# Patient Record
Sex: Male | Born: 1980 | Hispanic: Yes | Marital: Married | Smoking: Never smoker
Health system: Southern US, Community
[De-identification: ages and names within clinical notes are randomized; demographics above are authoritative.]

## PROBLEM LIST (undated history)

## (undated) DIAGNOSIS — I10 Essential (primary) hypertension: Secondary | ICD-10-CM

---

## 2016-11-02 ENCOUNTER — Encounter (HOSPITAL_COMMUNITY): Payer: Self-pay | Admitting: *Deleted

## 2016-11-02 ENCOUNTER — Other Ambulatory Visit: Payer: Self-pay

## 2016-11-02 ENCOUNTER — Emergency Department (HOSPITAL_COMMUNITY): Payer: Self-pay

## 2016-11-02 DIAGNOSIS — K1379 Other lesions of oral mucosa: Secondary | ICD-10-CM | POA: Insufficient documentation

## 2016-11-02 DIAGNOSIS — R0789 Other chest pain: Secondary | ICD-10-CM | POA: Insufficient documentation

## 2016-11-02 DIAGNOSIS — I10 Essential (primary) hypertension: Secondary | ICD-10-CM | POA: Insufficient documentation

## 2016-11-02 LAB — I-STAT TROPONIN, ED: Troponin i, poc: 0 ng/mL (ref 0.00–0.08)

## 2016-11-02 LAB — CBC
HEMATOCRIT: 39.2 % (ref 39.0–52.0)
Hemoglobin: 13.9 g/dL (ref 13.0–17.0)
MCH: 31.1 pg (ref 26.0–34.0)
MCHC: 35.5 g/dL (ref 30.0–36.0)
MCV: 87.7 fL (ref 78.0–100.0)
Platelets: 180 10*3/uL (ref 150–400)
RBC: 4.47 MIL/uL (ref 4.22–5.81)
RDW: 12.4 % (ref 11.5–15.5)
WBC: 6.7 10*3/uL (ref 4.0–10.5)

## 2016-11-02 LAB — BASIC METABOLIC PANEL
Anion gap: 10 (ref 5–15)
BUN: 26 mg/dL — AB (ref 6–20)
CHLORIDE: 102 mmol/L (ref 101–111)
CO2: 22 mmol/L (ref 22–32)
Calcium: 9.3 mg/dL (ref 8.9–10.3)
Creatinine, Ser: 0.94 mg/dL (ref 0.61–1.24)
GFR calc Af Amer: >60 mL/min (ref >60)
GFR calc non Af Amer: >60 mL/min (ref >60)
GLUCOSE: 109 mg/dL — AB (ref 65–99)
POTASSIUM: 3.5 mmol/L (ref 3.5–5.1)
Sodium: 134 mmol/L — ABNORMAL LOW (ref 135–145)

## 2016-11-02 MED ORDER — ONDANSETRON 4 MG PO TBDP
ORAL_TABLET | ORAL | Status: AC
  Filled 2016-11-02: qty 1

## 2016-11-02 MED ORDER — ONDANSETRON 4 MG PO TBDP
4.0000 mg | ORAL_TABLET | Freq: Once | ORAL | Status: AC | PRN
  Administered 2016-11-02: 4 mg via ORAL

## 2016-11-02 NOTE — ED Triage Notes (Signed)
Pt c/o difficulty swallowing after being around fumes today at work. Pt reports R sided upper chest discomfort and mild shortness of breath. Lung sounds clear. Endorses nausea. Spanish Interpreter used for triage

## 2016-11-03 ENCOUNTER — Emergency Department (HOSPITAL_COMMUNITY)
Admission: EM | Admit: 2016-11-03 | Discharge: 2016-11-03 | Disposition: A | Discharge status: Home / Self Care | Payer: Self-pay | Attending: Emergency Medicine | Admitting: Emergency Medicine

## 2016-11-03 DIAGNOSIS — R0789 Other chest pain: Secondary | ICD-10-CM

## 2016-11-03 DIAGNOSIS — K1379 Other lesions of oral mucosa: Secondary | ICD-10-CM

## 2016-11-03 HISTORY — DX: Essential (primary) hypertension: I10

## 2016-11-03 LAB — RAPID STREP SCREEN (MED CTR MEBANE ONLY): Streptococcus, Group A Screen (Direct): NEGATIVE

## 2016-11-03 LAB — TROPONIN I: Troponin I: <0.03 ng/mL (ref <0.03)

## 2016-11-03 LAB — D-DIMER, QUANTITATIVE: D-Dimer, Quant: 0.27 ug/mL-FEU (ref 0.00–0.50)

## 2016-11-03 MED ORDER — DIPHENHYDRAMINE HCL 50 MG/ML IJ SOLN
25.0000 mg | Freq: Once | INTRAMUSCULAR | Status: AC
Start: 2016-11-03 — End: 2016-11-03
  Administered 2016-11-03: 25 mg via INTRAVENOUS
  Filled 2016-11-03: qty 1

## 2016-11-03 MED ORDER — DIPHENHYDRAMINE HCL 25 MG PO TABS
25.0000 mg | ORAL_TABLET | Freq: Four times a day (QID) | ORAL | 0 refills | Status: AC
Start: 2016-11-03 — End: ?

## 2016-11-03 MED ORDER — FAMOTIDINE 20 MG PO TABS: 20.0000 mg | ORAL_TABLET | Freq: Two times a day (BID) | ORAL | 0 refills | Status: AC

## 2016-11-03 MED ORDER — PREDNISONE 50 MG PO TABS: ORAL_TABLET | ORAL | 0 refills | Status: AC

## 2016-11-03 MED ORDER — DEXAMETHASONE SODIUM PHOSPHATE 10 MG/ML IJ SOLN
10.0000 mg | Freq: Once | INTRAMUSCULAR | Status: AC
  Administered 2016-11-03: 10 mg via INTRAVENOUS
  Filled 2016-11-03: qty 1

## 2016-11-03 MED ORDER — FAMOTIDINE IN NACL 20-0.9 MG/50ML-% IV SOLN
20.0000 mg | Freq: Once | INTRAVENOUS | Status: AC
  Administered 2016-11-03: 20 mg via INTRAVENOUS
  Filled 2016-11-03: qty 50

## 2016-11-03 NOTE — Discharge Instructions (Signed)
There is no evidence of heart attack or blood clot in the lung. Stop taking your blood pressure medication. Follow up with your doctor. Return to the ED if you develop difficulty breathing, difficulty swallowing, chest pain, shortness of breath or any other concerns.

## 2016-11-03 NOTE — ED Provider Notes (Signed)
[facility]EMERGENCY DEPT Provider Note   CSN: [id number] Arrival date & time: 11/02/16  1953 By signing my name below, I, Levon HedgerElizabeth Hall, attest that this documentation has been prepared under the direction and in the presence of Glynn OctaveStephen Shalan Neault, MD . Electronically Signed: Levon HedgerElizabeth Hall, Scribe. 11/03/2016. 1:57 AM.   History   Chief Complaint Chief Complaint  Patient presents with  . Chest Pain   HPI Angel Whitehead is a 35 y.o. male with hx of HTN who presents to the Emergency Department complaining of waxing and waning left sided chest pain that does not radiate onset tonight at 6 pm.  Pt states he was getting home from work when the pain began. Pt lays concrete. 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 He notes associated throat tightness which began tonight at 6 pm as well. Pain is exacerbated by swallowing. No alleviating factors noted. Other than his HTN medication, pt is not taking any other medicines. He is a nonsmoker. Pt denies SOB, back pain, swelling of lips or tongue, abdominal pain, vomiting, or diarrhea. NKDA.  The history is provided by the patient. A language interpreter was used.   Past Medical History:  Diagnosis Date  . Hypertension    There are no active problems to display for this patient.  History reviewed. No pertinent surgical history.   Home Medications    Prior to Admission medications   Not on File    Family History No family history on file.  Social History Social History  Substance Use Topics  . Smoking status: Never Smoker  . Smokeless tobacco: Never Used  . Alcohol use No     Allergies   Patient has no known allergies.  Review of Systems Review of Systems 10 systems reviewed and all are negative for acute change except as noted in the HPI.   Physical Exam Updated Vital Signs BP 108/62 (BP Location: Left Arm)   Pulse (!) 59   Temp 98.1 F (36.7 C) (Oral)   Resp 16   SpO2 100%    Physical Exam  Constitutional: He is oriented to person, place, and time. He appears well-developed and well-nourished. No distress.  HENT:  Head: Normocephalic and atraumatic.  Mouth/Throat: Oropharynx is clear and moist. No oropharyngeal exudate.  Mild swelling of uvula. Floor of mouth is soft; no tongue or lip swelling.  Eyes: Conjunctivae and EOM are normal. Pupils are equal, round, and reactive to light.  Neck: Normal range of motion. Neck supple.  No meningismus.  Cardiovascular: Normal rate, regular rhythm, normal heart sounds and intact distal pulses.   No murmur heard. Pulmonary/Chest: Effort normal and breath sounds normal. No respiratory distress. He exhibits tenderness.  Reproducible left sided chest tenderness  Abdominal: Soft. There is no tenderness. There is no rebound and no guarding.  Musculoskeletal: Normal range of motion. He exhibits no edema or tenderness.  Neurological: He is alert and oriented to person, place, and time. No cranial nerve deficit. He exhibits normal muscle tone. Coordination normal.   5/5 strength throughout. CN 2-12 intact.Equal grip strength.   Skin: Skin is warm.  Psychiatric: He has a normal mood and affect. His behavior is normal.  Nursing note and vitals reviewed.  ED Treatments / Results  DIAGNOSTIC STUDIES:  Oxygen Saturation is 100% on RA, normal by my interpretation.    COORDINATION OF CARE:  1:55 AM Discussed treatment plan with pt at bedside and pt agreed to plan.  Labs (  all labs ordered are listed, but only abnormal results are displayed) Labs Reviewed  BASIC METABOLIC PANEL - Abnormal; Notable for the following:       Result Value   Sodium 134 (*)    Glucose, Bld 109 (*)    BUN 26 (*)    All other components within normal limits  CBC  I-STAT TROPOININ, ED    EKG  EKG Interpretation  Date/Time:  Thursday November 02 2016 20:06:47 EST Ventricular Rate:  75 PR Interval:  150 QRS Duration: 82 QT Interval:  378 QTC  Calculation: 422 R Axis:   62 Text Interpretation:  Normal sinus rhythm Normal ECG No previous ECGs available Confirmed by Manus GunningANCOUR  MD, Sally Reimers [phone]([id number]) on 11/03/2016 1:12:08 AM       Radiology Dg Chest 2 View  Result Date: 11/02/2016 CLINICAL DATA:  Acute onset of left-sided chest pain and nausea. Sore throat. Initial encounter. EXAM: CHEST  2 VIEW COMPARISON:  None. FINDINGS: The lungs are well-aerated. Mild peribronchial thickening is noted. There is no evidence of focal opacification, pleural effusion or pneumothorax. The heart is normal in size; the mediastinal contour is within normal limits. No acute osseous abnormalities are seen. IMPRESSION: Mild peribronchial thickening noted.  Lungs otherwise grossly clear. Electronically Signed   By: Roanna RaiderJeffery  Chang M.D.   On: 11/02/2016 20:51    Procedures Procedures (including critical care time)  Medications Ordered in ED Medications  ondansetron (ZOFRAN-ODT) disintegrating tablet 4 mg (4 mg Oral Given 11/02/16 2012)     Initial Impression / Assessment and Plan / ED Course  I have reviewed the triage vital signs and the nursing notes.  Pertinent labs & imaging results that were available during my care of the patient were reviewed by me and considered in my medical decision making (see chart for details).  Clinical Course   Patient presents with difficulty swallowing, throat pain and right-sided chest pain onset today after being around some fumes at work. This is gradually improving. Patient with reproducible chest pain on exam.  EKG is normal sinus rhythm. There is mild swelling of his uvula.  Patient takes an unknown blood pressure medication. Pharmacy technician was unable to identify this medication. Patient states that the pink pill which could possibly be lisinopril.  Patient given steroids, antihistamines and observed. Possibly angioedema related possibly to lisinopril.  Chest pain is atypical for ACS.  Patient has been  observed in the ED for extended period of time >9 hours. There is been no increase in his uvular swelling. He has no difficulty speaking or swallowing. Patient offered observation but he wishes to go home. Discussed with patient as could be angioedema related to his blood pressure medication which he should stop. He will be given a course of steroids and antihistamines. Return to the ED with difficulty breathing, difficulty swallowing, chest pain or shortness of breath. Patient understands to follow-up with his doctor regarding need for blood pressure medication.   Final Clinical Impressions(s) / ED Diagnoses   Final diagnoses:  Atypical chest pain  Uvular swelling    New Prescriptions New Prescriptions   No medications on file   I personally performed the services described in this documentation, which was scribed in my presence. The recorded information has been reviewed and is accurate.   Glynn OctaveStephen Maysoon Lozada, MD 11/03/16 [phone]

## 2016-11-03 NOTE — ED Notes (Signed)
MD bedside, assess w/ use of interpretor.  RN explained discharge instructions including follow-up at that time.  Pt verbalized understanding

## 2016-11-03 NOTE — ED Notes (Signed)
Patient unsure of blood pressure medicine, pharmacy called around and there is no record of him taking any.

## 2016-11-05 LAB — CULTURE, GROUP A STREP (THRC)

## 2017-07-07 ENCOUNTER — Encounter (HOSPITAL_COMMUNITY): Payer: Self-pay | Admitting: *Deleted

## 2017-07-07 ENCOUNTER — Emergency Department (HOSPITAL_COMMUNITY): Payer: Self-pay

## 2017-07-07 ENCOUNTER — Emergency Department (HOSPITAL_COMMUNITY)
Admission: EM | Admit: 2017-07-07 | Discharge: 2017-07-08 | Disposition: A | Discharge status: Home / Self Care | Payer: Self-pay | Attending: Emergency Medicine | Admitting: Emergency Medicine

## 2017-07-07 DIAGNOSIS — Z79899 Other long term (current) drug therapy: Secondary | ICD-10-CM | POA: Insufficient documentation

## 2017-07-07 DIAGNOSIS — I10 Essential (primary) hypertension: Secondary | ICD-10-CM | POA: Insufficient documentation

## 2017-07-07 DIAGNOSIS — R072 Precordial pain: Secondary | ICD-10-CM | POA: Insufficient documentation

## 2017-07-07 DIAGNOSIS — R0602 Shortness of breath: Secondary | ICD-10-CM | POA: Insufficient documentation

## 2017-07-07 LAB — BASIC METABOLIC PANEL
ANION GAP: 11 (ref 5–15)
BUN: 15 mg/dL (ref 6–20)
CALCIUM: 9.4 mg/dL (ref 8.9–10.3)
CO2: 23 mmol/L (ref 22–32)
Chloride: 98 mmol/L — ABNORMAL LOW (ref 101–111)
Creatinine, Ser: 0.8 mg/dL (ref 0.61–1.24)
GFR calc Af Amer: >60 mL/min (ref >60)
GFR calc non Af Amer: >60 mL/min (ref >60)
GLUCOSE: 158 mg/dL — AB (ref 65–99)
POTASSIUM: 3.8 mmol/L (ref 3.5–5.1)
Sodium: 132 mmol/L — ABNORMAL LOW (ref 135–145)

## 2017-07-07 LAB — CBC
HEMATOCRIT: 42 % (ref 39.0–52.0)
HEMOGLOBIN: 14.2 g/dL (ref 13.0–17.0)
MCH: 30.1 pg (ref 26.0–34.0)
MCHC: 33.8 g/dL (ref 30.0–36.0)
MCV: 89.2 fL (ref 78.0–100.0)
Platelets: 174 10*3/uL (ref 150–400)
RBC: 4.71 MIL/uL (ref 4.22–5.81)
RDW: 12.7 % (ref 11.5–15.5)
WBC: 8.2 10*3/uL (ref 4.0–10.5)

## 2017-07-07 LAB — TROPONIN I: Troponin I: <0.03 ng/mL (ref <0.03)

## 2017-07-07 NOTE — ED Provider Notes (Signed)
[facility] Provider Note   CSN: [id number] Arrival date & time: 07/07/17  2013 By signing my name below, I, Levon HedgerElizabeth Hall, attest that this documentation has been prepared under the direction and in the presence of Zadie RhineWickline, Lakeithia Rasor, MD . Electronically Signed: Levon HedgerElizabeth Hall, Scribe. 07/07/2017. 11:37 PM.   History   Chief Complaint Chief Complaint  Patient presents with  . Chest Pain    HPI Angel Whitehead is a 36 y.o. male with a history of HTN  who presents to the Emergency Department complaining of constant, moderate left-sided chest pain onset today at 3 pm. He describes this as pressure-like, non-radiating chest pain that is exacerbated by breathing. He reports associated fever, nausea, vomiting, and shortness of breath. No OTC treatments tried for these symptoms PTA.  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 He also  had some chest pain yesterday and two days ago which had resolved. Pt is a nonsmoker. No recent travel. No personal history of MI, DVT, or PE. He denies any abdominal pain, cough, or back pain.  PCP: Wanda PlumpPaz, Jose E, MD   The history is provided by the patient. A language interpreter was used (nephew provided interpretation).  Chest Pain   This is a recurrent problem. The current episode started 6 to 12 hours ago. The problem occurs constantly. The problem has not changed since onset.The pain is associated with breathing. The pain is present in the lateral region (left). The pain is at a severity of 5/10. The pain is moderate. The quality of the pain is described as pressure-like. The pain does not radiate. The symptoms are aggravated by deep breathing. Associated symptoms include nausea, shortness of breath and vomiting. Pertinent negatives include no abdominal pain, no back pain, no cough, no leg pain and no lower extremity edema. He has tried nothing for the symptoms. The treatment provided no relief.  His past medical  history is significant for hypertension.  Pertinent negatives for past medical history include no DVT, no MI and no PE.    Past Medical History:  Diagnosis Date  . Hypertension     There are no active problems to display for this patient.   History reviewed. No pertinent surgical history.    Home Medications    Prior to Admission medications   Medication Sig Start Date End Date Taking? Authorizing Provider  diphenhydrAMINE (BENADRYL) 25 MG tablet Take 1 tablet (25 mg total) by mouth every 6 (six) hours. 11/03/16   Rancour, Jeannett SeniorStephen, MD  famotidine (PEPCID) 20 MG tablet Take 1 tablet (20 mg total) by mouth 2 (two) times daily. 11/03/16   Rancour, Jeannett SeniorStephen, MD  predniSONE (DELTASONE) 50 MG tablet 1 tablet PO daily 11/03/16   Rancour, Jeannett SeniorStephen, MD  PRESCRIPTION MEDICATION Take 1 tablet by mouth daily. Blood pressure    [provider]    Family History No family history on file.  Social History Social History  Substance Use Topics  . Smoking status: Never Smoker  . Smokeless tobacco: Never Used  . Alcohol use No     Allergies   Lisinopril   Review of Systems Review of Systems  Respiratory: Positive for shortness of breath. Negative for cough.   Cardiovascular: Positive for chest pain.  Gastrointestinal: Positive for nausea and vomiting. Negative for abdominal pain.  Musculoskeletal: Negative for back pain.  All other systems reviewed and are negative.  Physical Exam Updated Vital Signs BP 139/79   Pulse 77  Temp 98.7 F (37.1 C) (Oral)   Resp 19   Ht 5\' 10"  (1.778 m)   Wt 185 lb (83.9 kg)   SpO2 97%   BMI 26.54 kg/m   Physical Exam CONSTITUTIONAL: Well developed/well nourished HEAD: Normocephalic/atraumatic EYES: EOMI/PERRL ENMT: Mucous membranes moist NECK: supple no meningeal signs SPINE/BACK:entire spine nontender CV: S1/S2 noted, no murmurs/rubs/gallops noted LUNGS: Lungs are clear to auscultation bilaterally, no apparent distress CHEST:  Left sided chest wall tenderness  ABDOMEN: soft, nontender, no rebound or guarding, bowel sounds noted throughout abdomen GU:no cva tenderness NEURO: Pt is awake/alert/appropriate, moves all extremitiesx4.  No facial droop.   EXTREMITIES: pulses normal/equal, full ROM. No calf tenderness or edema  SKIN: warm, color normal PSYCH: no abnormalities of mood noted, alert and oriented to situation   ED Treatments / Results  DIAGNOSTIC STUDIES:  Oxygen Saturation is 97% on RA, normal by my interpretation.    COORDINATION OF CARE:  11:36 PM Discussed treatment plan with pt at bedside and pt agreed to plan.  Labs (all labs ordered are listed, but only abnormal results are displayed) Labs Reviewed  BASIC METABOLIC PANEL - Abnormal; Notable for the following:       Result Value   Sodium 132 (*)    Chloride 98 (*)    Glucose, Bld 158 (*)    All other components within normal limits  CBC  TROPONIN I  TROPONIN I    EKG  EKG Interpretation  Date/Time:  Saturday July 07 2017 20:30:09 EDT Ventricular Rate:  95 PR Interval:  136 QRS Duration: 86 QT Interval:  350 QTC Calculation: 439 R Axis:   60 Text Interpretation:  Normal sinus rhythm Normal ECG Confirmed by Blane Ohara [phone]) on 07/07/2017 10:58:03 PM       Radiology Dg Chest 2 View  Result Date: 07/07/2017 CLINICAL DATA:  Chest pain for 3 days, left-sided EXAM: CHEST  2 VIEW COMPARISON:  11/02/2016 FINDINGS: Normal heart size and mediastinal contours. No acute infiltrate or edema. No effusion or pneumothorax. No acute osseous findings. IMPRESSION: Negative chest. Electronically Signed   By: Marnee Spring M.D.   On: 07/07/2017 21:14    Procedures Procedures (including critical care time)  Medications Ordered in ED Medications - No data to display   Initial Impression / Assessment and Plan / ED Course  I have reviewed the triage vital signs and the nursing notes.  Pertinent labs & imaging results that were available  during my care of the patient were reviewed by me and considered in my medical decision making (see chart for details).     Pt well appearing Due to lack of interpreter on language line after waiting several minutes I was forced to use family as interpreter and patient understands some english Low suspicion for ACS/PE/Dissection He appears PERC negative HEART score = 2  Will d/c home Instructions in spanish given and I highlighted areas that should trigger repeat ER visit   Final Clinical Impressions(s) / ED Diagnoses   Final diagnoses:  Precordial pain    New Prescriptions New Prescriptions   No medications on file    I personally performed the services described in this documentation, which was scribed in my presence. The recorded information has been reviewed and is accurate.        Zadie Rhine, MD 07/08/17 [phone]

## 2017-07-07 NOTE — ED Triage Notes (Signed)
The pt is c/o lt upper chest pain for 3 days with dizziness nausea

## 2017-07-08 LAB — TROPONIN I: Troponin I: <0.03 ng/mL (ref <0.03)

## 2018-06-26 IMAGING — DX DG CHEST 2V
2 series · 2 of 2 positions shown · non-contrast
Comparison: None.

CLINICAL DATA: Acute onset of left-sided chest pain and nausea.
Sore throat. Initial encounter.

EXAM:
CHEST  2 VIEW

[chest pa]
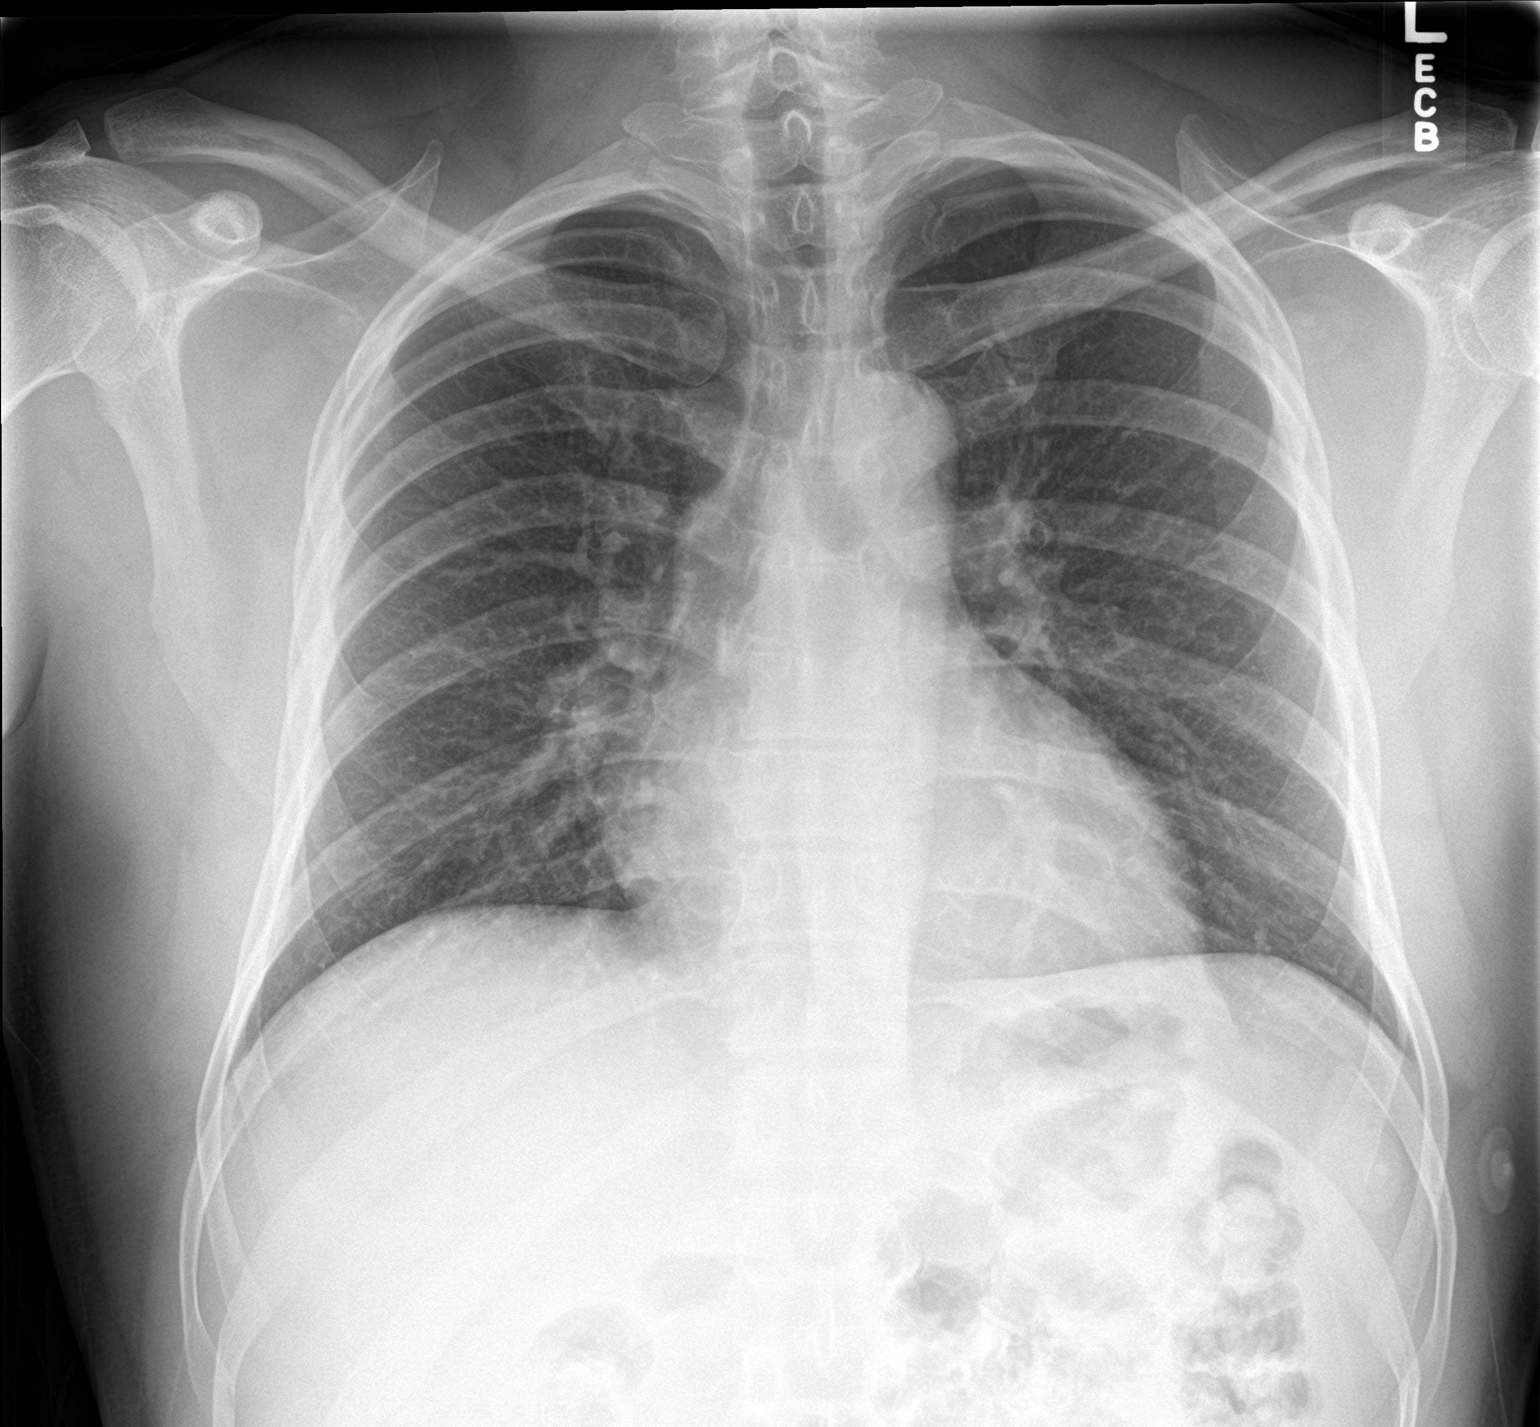

[chest lat]
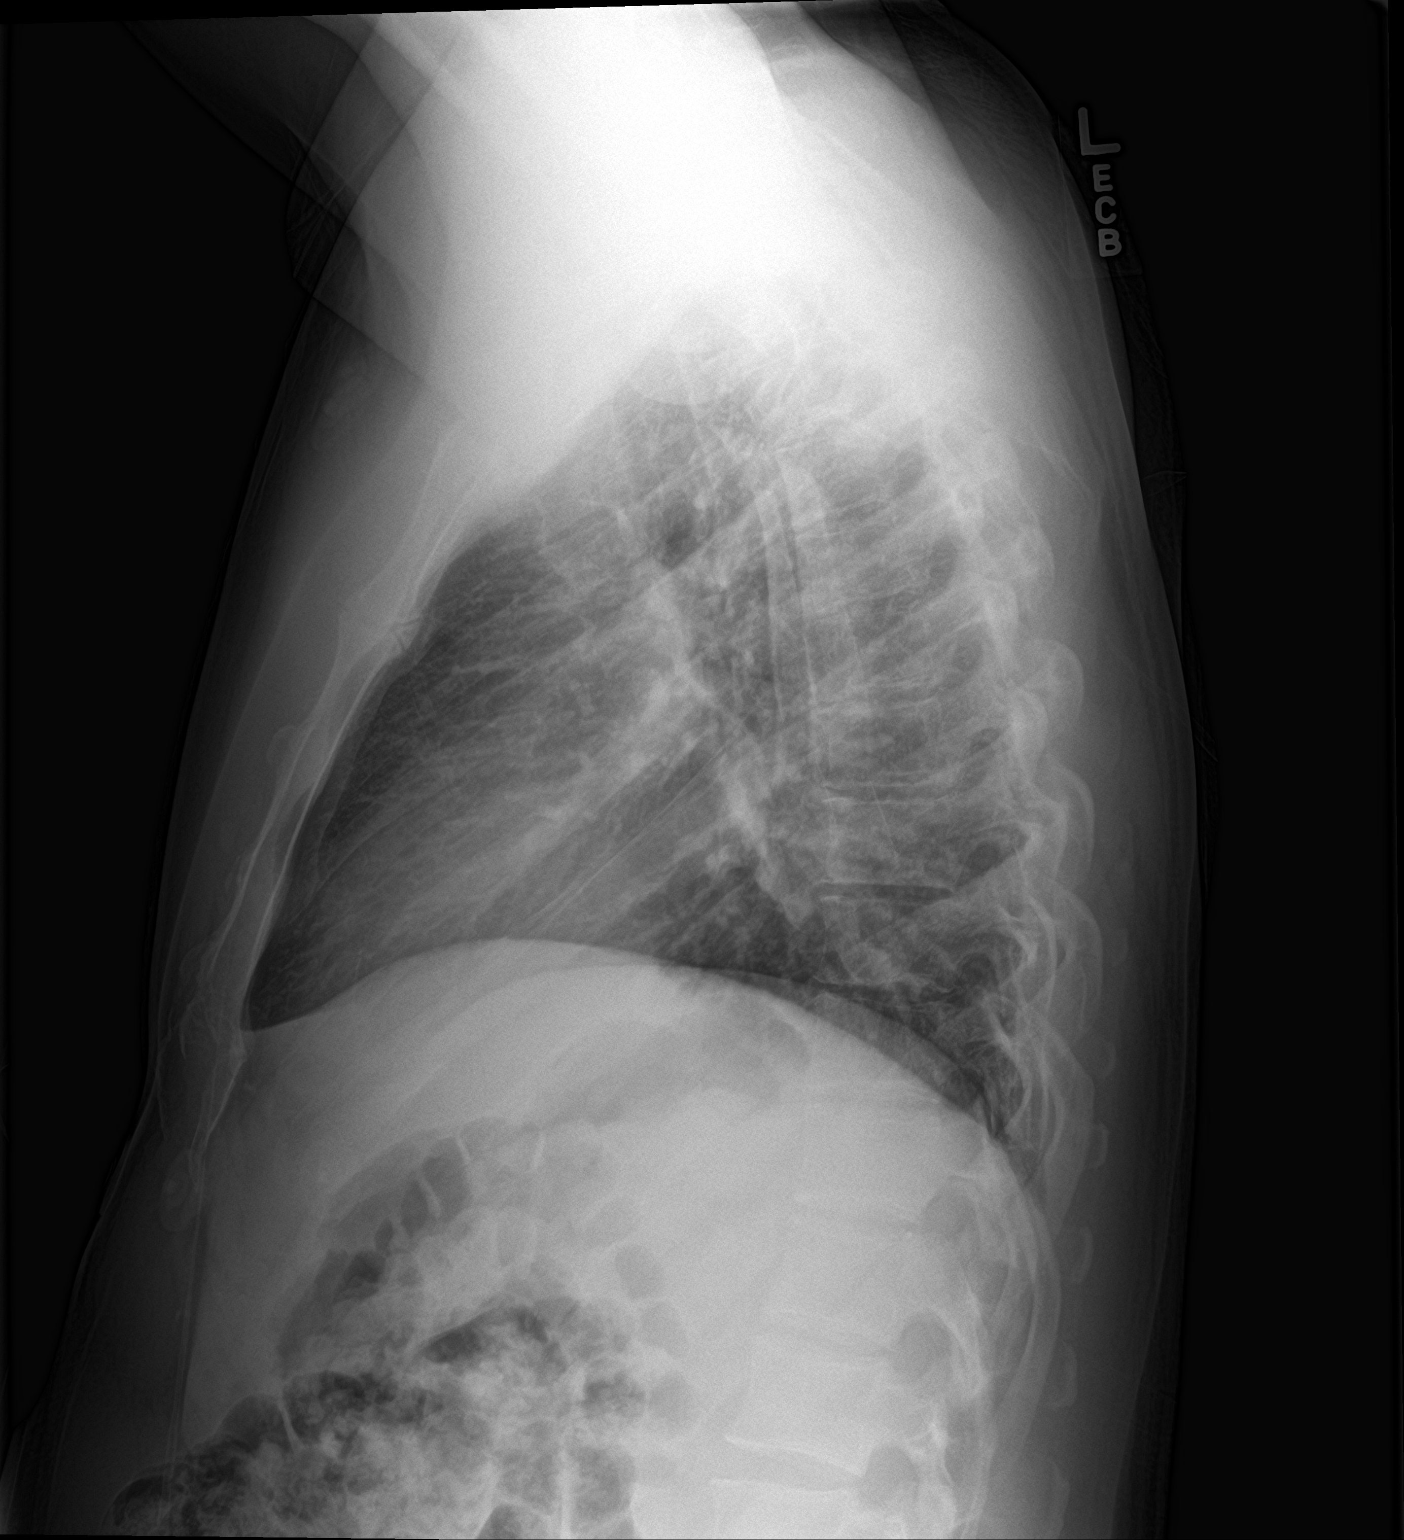

[2 of 2 positions shown; findings below may reference images not displayed]

FINDINGS: The lungs are well-aerated. Mild peribronchial thickening is noted.
There is no evidence of focal opacification, pleural effusion or
pneumothorax.

The heart is normal in size; the mediastinal contour is within
normal limits. No acute osseous abnormalities are seen.
IMPRESSION: Mild peribronchial thickening noted.  Lungs otherwise grossly clear.

## 2019-02-28 IMAGING — DX DG CHEST 2V
2 series · 2 of 2 positions shown · non-contrast
Comparison: 11/02/2016

CLINICAL DATA: Chest pain for 3 days, left-sided

EXAM:
CHEST  2 VIEW

[chest pa]
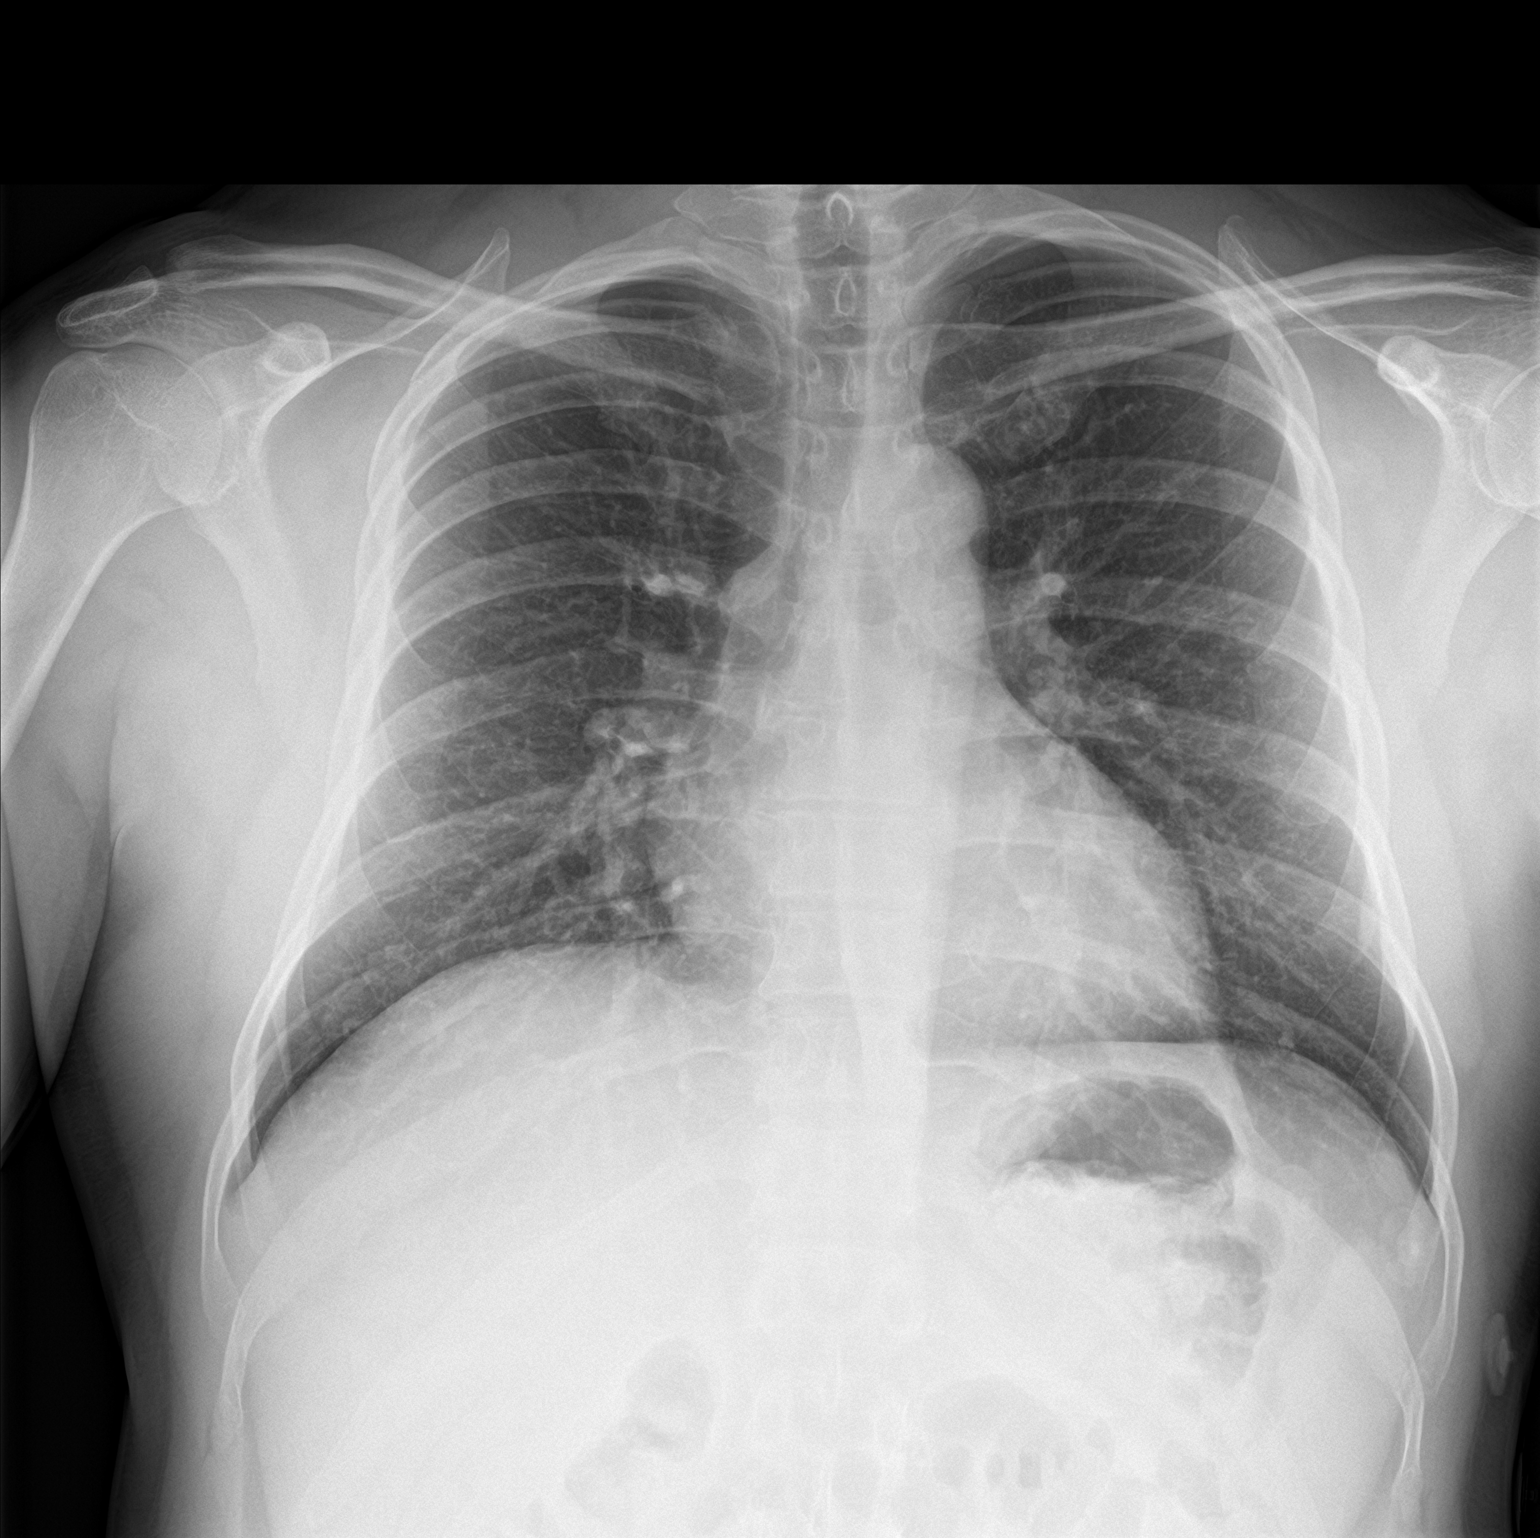

[chest lat]
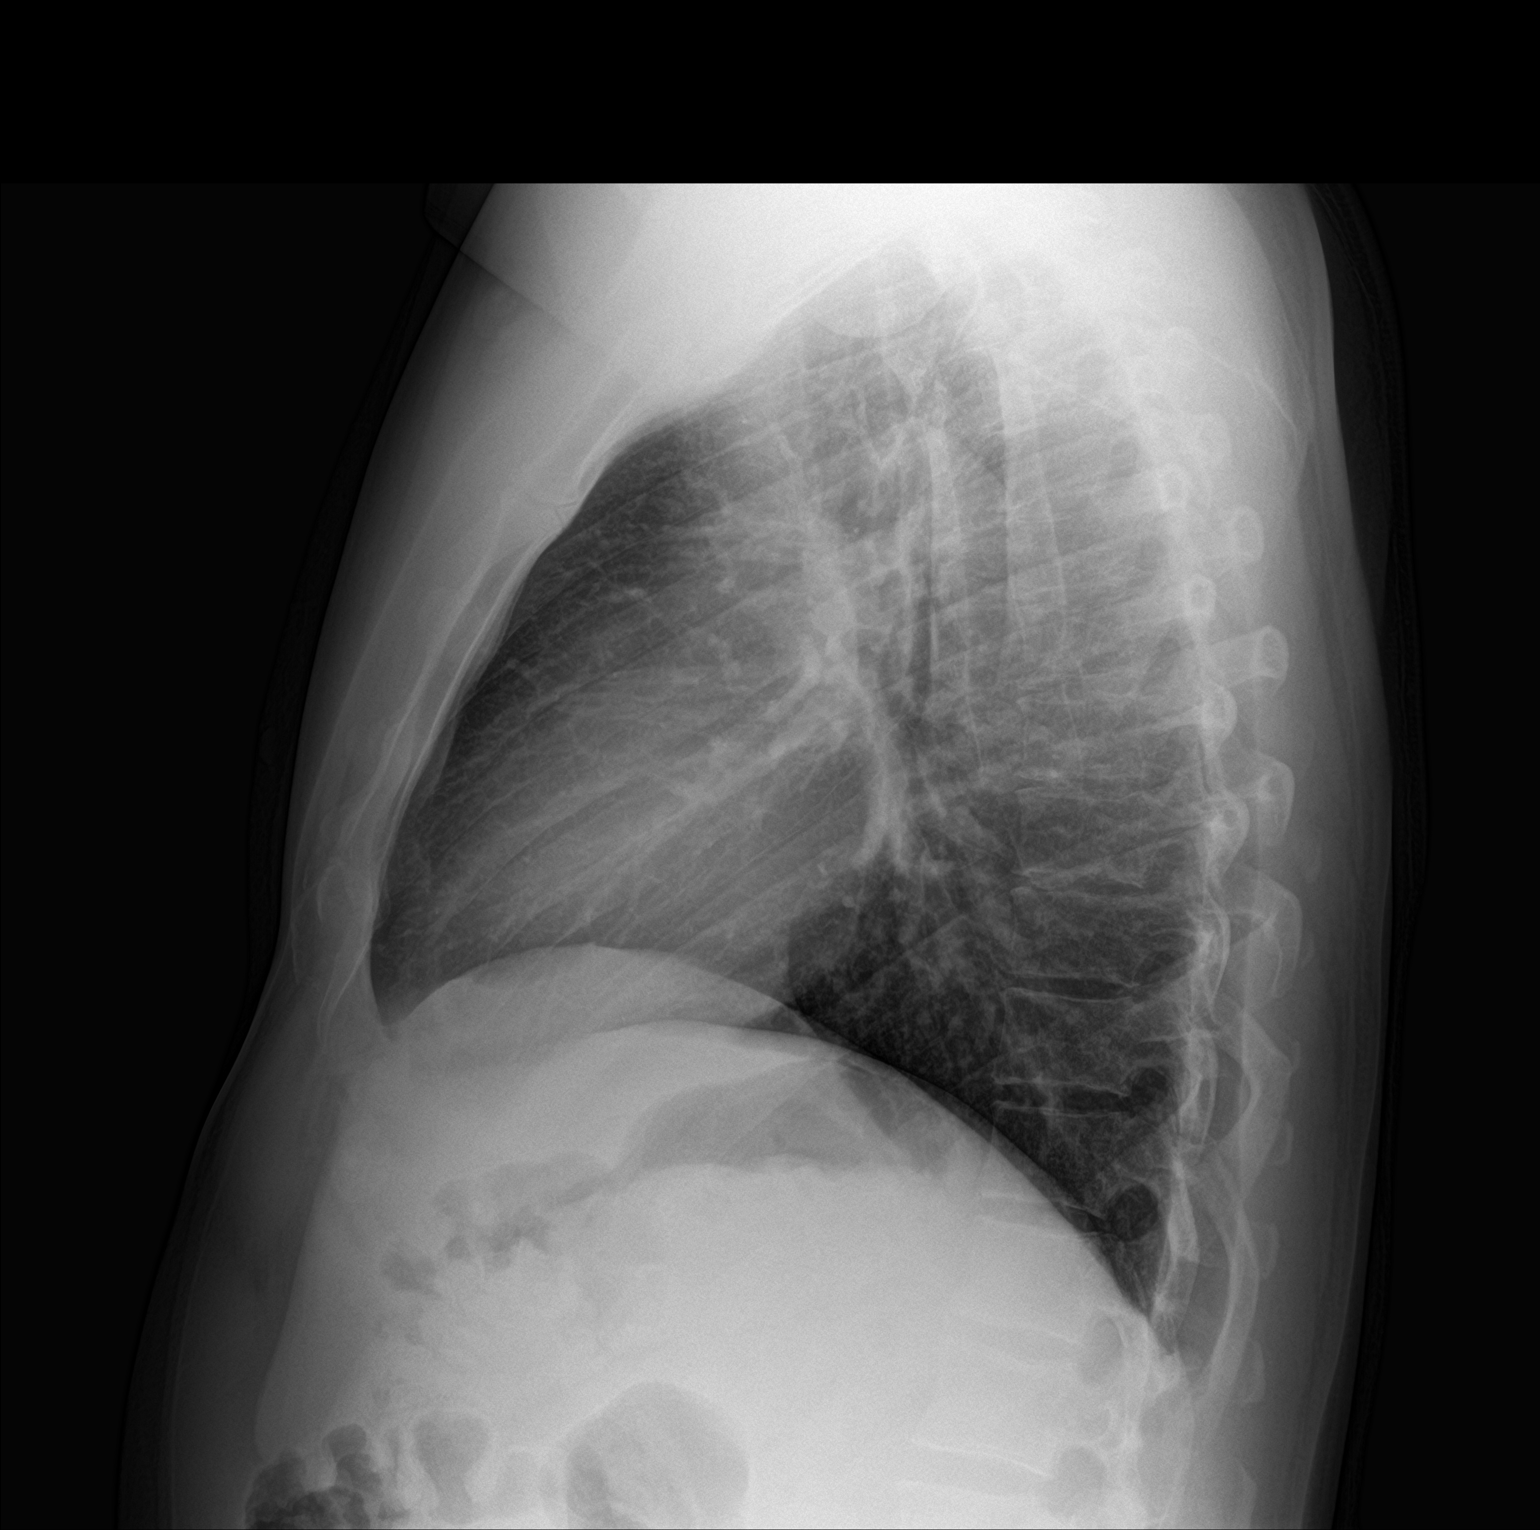

[2 of 2 positions shown; findings below may reference images not displayed]

FINDINGS: Normal heart size and mediastinal contours. No acute infiltrate or
edema. No effusion or pneumothorax. No acute osseous findings.
IMPRESSION: Negative chest.
